# Patient Record
Sex: Male | Born: 1990 | Race: White | Hispanic: No | Marital: Single | Smoking: Never smoker
Health system: Southern US, Community
[De-identification: ages and names within clinical notes are randomized; demographics above are authoritative.]

---

## 2014-10-16 ENCOUNTER — Emergency Department (HOSPITAL_COMMUNITY): Payer: PRIVATE HEALTH INSURANCE

## 2014-10-16 ENCOUNTER — Encounter (HOSPITAL_COMMUNITY): Payer: Self-pay | Admitting: Family Medicine

## 2014-10-16 ENCOUNTER — Emergency Department (HOSPITAL_COMMUNITY)
Admission: EM | Admit: 2014-10-16 | Discharge: 2014-10-16 | Disposition: A | Discharge status: Home / Self Care | Payer: PRIVATE HEALTH INSURANCE | Attending: Emergency Medicine | Admitting: Emergency Medicine

## 2014-10-16 DIAGNOSIS — S99911A Unspecified injury of right ankle, initial encounter: Secondary | ICD-10-CM | POA: Diagnosis present

## 2014-10-16 DIAGNOSIS — X58XXXA Exposure to other specified factors, initial encounter: Secondary | ICD-10-CM | POA: Diagnosis not present

## 2014-10-16 DIAGNOSIS — Y998 Other external cause status: Secondary | ICD-10-CM | POA: Diagnosis not present

## 2014-10-16 DIAGNOSIS — R52 Pain, unspecified: Secondary | ICD-10-CM

## 2014-10-16 DIAGNOSIS — S93401A Sprain of unspecified ligament of right ankle, initial encounter: Secondary | ICD-10-CM

## 2014-10-16 DIAGNOSIS — Y9289 Other specified places as the place of occurrence of the external cause: Secondary | ICD-10-CM | POA: Insufficient documentation

## 2014-10-16 DIAGNOSIS — Y9389 Activity, other specified: Secondary | ICD-10-CM | POA: Diagnosis not present

## 2014-10-16 MED ORDER — IBUPROFEN 600 MG PO TABS: 600.0000 mg | ORAL_TABLET | Freq: Four times a day (QID) | ORAL | Status: AC | PRN

## 2014-10-16 MED ORDER — HYDROCODONE-ACETAMINOPHEN 5-325 MG PO TABS: 1.0000 | ORAL_TABLET | Freq: Four times a day (QID) | ORAL | Status: AC | PRN

## 2014-10-16 NOTE — ED Provider Notes (Signed)
CSN: [id number]     Arrival date & time 10/16/14  1029 History  This chart was scribed for non-physician practitioner, Roxy Horsemanobert Anthonny Schiller, PA-C working with Tilden FossaElizabeth Rees, MD by Placido SouLogan Joldersma, ED scribe. This patient was seen in room TR09C/TR09C and the patient's care was started at 11:22 AM.    Chief Complaint  Patient presents with  . Ankle Injury    The history is provided by the patient. No language interpreter was used.    HPI Comments: Christian Moreno is a 24 y.o. male who presents to the Emergency Department complaining of constant, mild, right ankle pain and swelling after a trampoline accident 2 days ago. He additionally notes exacerbation of symptoms when ambulating. Pt confirmed taking ibuprofen for pain with no relief of symptoms.    History reviewed. No pertinent past medical history. History reviewed. No pertinent past surgical history. History reviewed. No pertinent family history. History  Substance Use Topics  . Smoking status: Never Smoker   . Smokeless tobacco: Not on file  . Alcohol Use: No    Review of Systems  Musculoskeletal: Positive for myalgias, joint swelling, arthralgias and gait problem.      Allergies  Review of patient's allergies indicates no known allergies.  Home Medications   Prior to Admission medications   Not on File   BP 137/74 mmHg  Pulse 60  Temp(Src) 98.5 F (36.9 C) (Oral)  Ht 5\' 11"  (1.803 m)  Wt 187 lb 6.3 oz (85 kg)  BMI 26.15 kg/m2  SpO2 100% Physical Exam  Constitutional: He is oriented to person, place, and time. He appears well-developed and well-nourished. No distress.  HENT:  Head: Normocephalic and atraumatic.  Mouth/Throat: Oropharynx is clear and moist.  Eyes: Conjunctivae and EOM are normal. Pupils are equal, round, and reactive to light.  Neck: Normal range of motion. Neck supple. No tracheal deviation present.  Cardiovascular: Normal rate and intact distal pulses.   Intact distal pulses with brisk capillary  refill  Pulmonary/Chest: Breath sounds normal. No respiratory distress.  Abdominal: Soft.  Musculoskeletal: Normal range of motion.  Right ankle tender to palpation over the lateral and medial malleolus, and there is moderate swelling, and contusions about the ankle, range of motion and strength is limited secondary to pain  Neurological: He is alert and oriented to person, place, and time.  Sensation intact  Skin: Skin is warm and dry.  Psychiatric: He has a normal mood and affect. His behavior is normal.  Nursing note and vitals reviewed.   ED Course  Procedures  DIAGNOSTIC STUDIES: Oxygen Saturation is 100% on RA, normal by my interpretation.    COORDINATION OF CARE: 11:23 AM Discussed treatment plan with pt at bedside including an x-ray and pt agreed to plan.  Labs Review Labs Reviewed - No data to display  Imaging Review Dg Ankle Complete Right  10/16/2014   CLINICAL DATA:  Twisting injury of right ankle 3 days ago with pain, swelling and discoloration.  EXAM: RIGHT ANKLE - COMPLETE 3+ VIEW  COMPARISON:  None.  FINDINGS: Soft tissue swelling seen surrounding the ankle without evidence of acute fracture or dislocation. Ankle mortise shows normal alignment. No arthropathy identified. No evidence of bony lesion or destruction.  IMPRESSION: Soft tissue swelling without evidence of acute fracture.   Electronically Signed   By: Irish LackGlenn  Yamagata M.D.   On: 10/16/2014 12:30     EKG Interpretation None      MDM   Final diagnoses:  Ankle sprain, right, initial encounter  Patient with ankle sprain, will treat with Aircast, crutches. We'll treat pain. Recommend orthopedic follow-up. Plain films are negative.  I personally performed the services described in this documentation, which was scribed in my presence. The recorded information has been reviewed and is accurate.      Roxy Horseman, PA-C 10/16/14 1309  Tilden Fossa, MD 10/16/14 [phone]

## 2014-10-16 NOTE — ED Notes (Signed)
Pt twisted right ankle while at camp 3 days ago. Has been unable to bear weight since that time. Right ankle swollen and discolored.

## 2014-10-16 NOTE — ED Notes (Signed)
Pt here for right ankle pain and swelling after trampoline accident.

## 2014-10-16 NOTE — ED Notes (Signed)
Ortho tech notified.  

## 2014-10-16 NOTE — Progress Notes (Signed)
Orthopedic Tech Progress Note Patient Details:  Christian Moreno 02/03/1991 [id number]  Ortho Devices Type of Ortho Device: ASO, Crutches Ortho Device/Splint Location: rle Ortho Device/Splint Interventions: Application Viewed order from doctor's order list  Nikki DomCrawford, Jaren Vanetten 10/16/2014, 12:46 PM

## 2014-10-16 NOTE — Discharge Instructions (Signed)

## 2016-12-25 IMAGING — CR DG ANKLE COMPLETE 3+V*R*
3 series · 3 of 3 positions shown · non-contrast
Comparison: None.

CLINICAL DATA: Twisting injury of right ankle 3 days ago with pain,
swelling and discoloration.

EXAM:
RIGHT ANKLE - COMPLETE 3+ VIEW

[ankle ap]
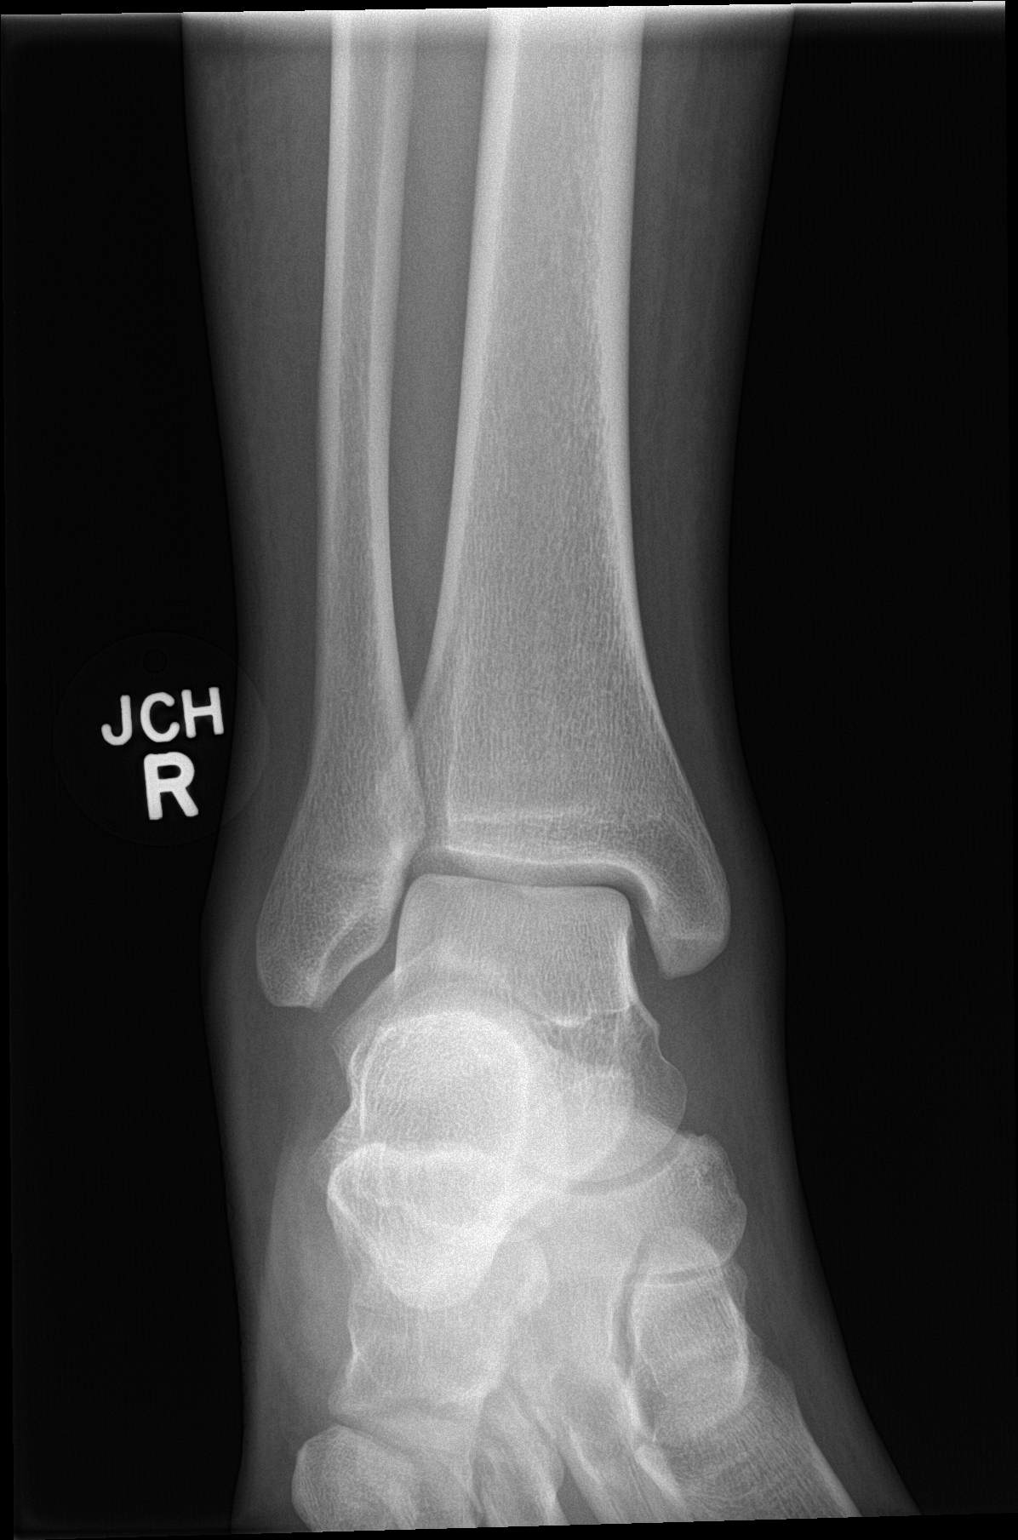

[ankle obl]
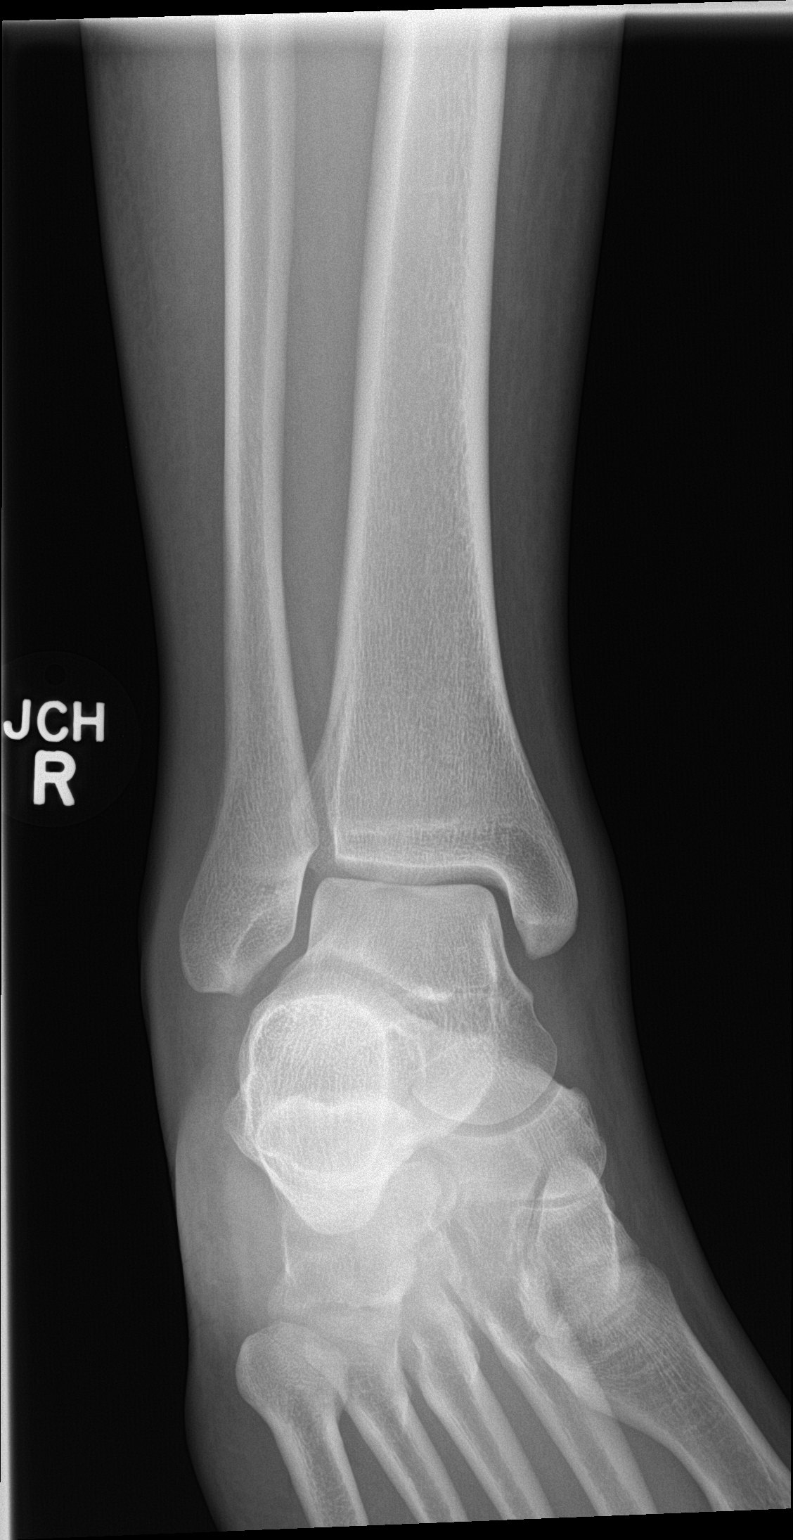

[ankle lat]
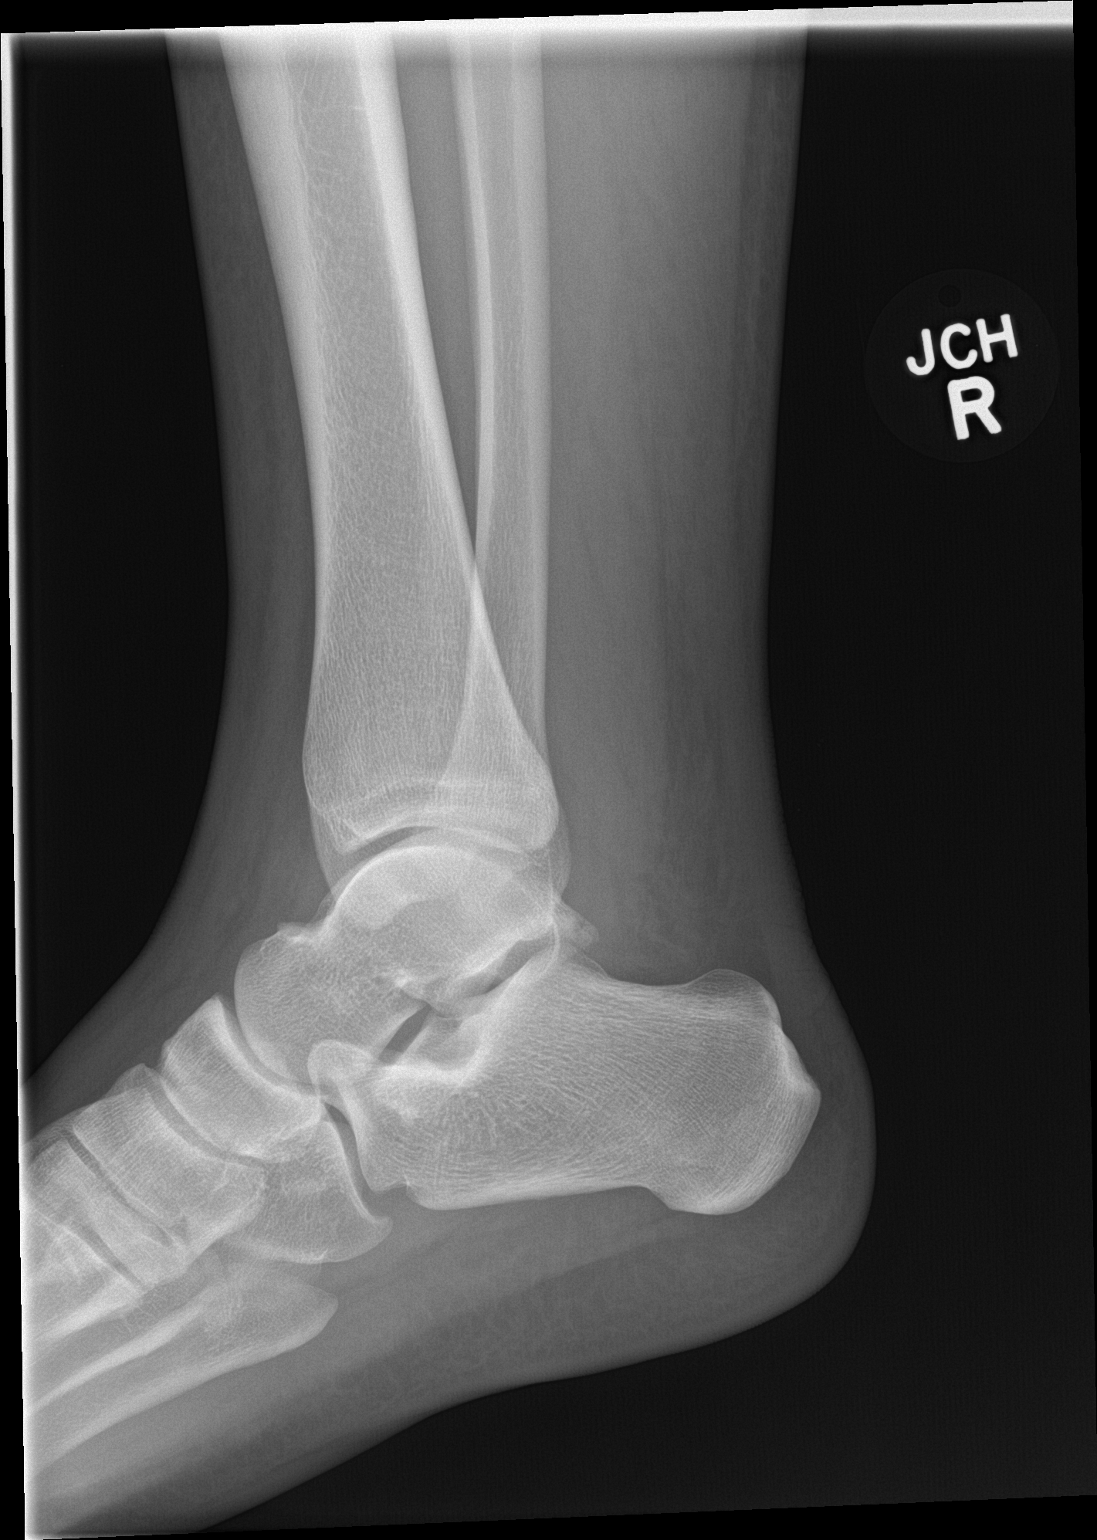

[3 of 3 positions shown; findings below may reference images not displayed]

FINDINGS: Soft tissue swelling seen surrounding the ankle without evidence of
acute fracture or dislocation. Ankle mortise shows normal alignment.
No arthropathy identified. No evidence of bony lesion or
destruction.
IMPRESSION: Soft tissue swelling without evidence of acute fracture.
